# Patient Record
Sex: Female | Born: 1940 | Race: White | Hispanic: No
Health system: Midwestern US, Community
[De-identification: ages and names within clinical notes are randomized; demographics above are authoritative.]

## PROBLEM LIST (undated history)

## (undated) DIAGNOSIS — E785 Hyperlipidemia, unspecified: Secondary | ICD-10-CM

## (undated) DIAGNOSIS — I2510 Atherosclerotic heart disease of native coronary artery without angina pectoris: Secondary | ICD-10-CM

## (undated) DIAGNOSIS — R42 Dizziness and giddiness: Secondary | ICD-10-CM

## (undated) DIAGNOSIS — E039 Hypothyroidism, unspecified: Secondary | ICD-10-CM

## (undated) DIAGNOSIS — F419 Anxiety disorder, unspecified: Secondary | ICD-10-CM

## (undated) DIAGNOSIS — I10 Essential (primary) hypertension: Secondary | ICD-10-CM

## (undated) HISTORY — DX: Anxiety disorder, unspecified: F41.9

## (undated) HISTORY — DX: Hypothyroidism, unspecified: E03.9

## (undated) HISTORY — PX: CHOLECYSTECTOMY: SHX55

## (undated) HISTORY — DX: Hyperlipidemia, unspecified: E78.5

---

## 2014-06-08 ENCOUNTER — Ambulatory Visit: Admit: 2014-06-08 | Discharge: 2014-06-08 | Payer: BLUE CROSS/BLUE SHIELD | Attending: Family | Primary: Family Medicine

## 2014-06-08 DIAGNOSIS — J0111 Acute recurrent frontal sinusitis: Secondary | ICD-10-CM

## 2014-06-08 MED ORDER — AMOXICILLIN-POT CLAVULANATE 875-125 MG PO TABS
875-125 MG | ORAL_TABLET | Freq: Two times a day (BID) | ORAL | Status: AC
Start: 2014-06-08 — End: 2014-06-18

## 2014-06-08 NOTE — Progress Notes (Signed)
[facility] [place]  [address]  [place] [address]  Dept: [phone]  Dept Fax: [phone]    Roberta Barrera is a 74 y.o. female who presents today for her medical conditions/complaints as noted below.  Earnest Conroy is c/o of Sinus Problem        HPI:     Sinusitis  The current episode started 1 to 4 weeks ago. The problem is unchanged. Associated symptoms include chills, congestion, coughing and sinus pressure. Pertinent negatives include no diaphoresis, ear pain, headaches, shortness of breath, sneezing or sore throat. Treatments tried: sudafed mucinex.     Has been sick since 05-07-14, otc not working anymore, loss sense of taste and smell, does have hx of sinus issues, ahd pus pocket in mouth where partial sits this am, no pain or fever  Past Medical History   Diagnosis Date   ??? CAD (coronary artery disease)    ??? Hypertension       Past Surgical History   Procedure Laterality Date   ??? Gallbladder surgery         History reviewed. No pertinent family history.    History   Substance Use Topics   ??? Smoking status: Current Some Day Smoker   ??? Smokeless tobacco: Not on file      Comment: trying to quit as of today   ??? Alcohol Use: Not on file      Current Outpatient Prescriptions   Medication Sig Dispense Refill   ??? amoxicillin-clavulanate (AUGMENTIN) 875-125 MG per tablet Take 1 tablet by mouth 2 times daily for 10 days 20 tablet 0     No current facility-administered medications for this visit.     No Known Allergies    There are no preventive care reminders to display for this patient.    Subjective:      Review of Systems   Constitutional: Positive for chills and fatigue. Negative for fever, diaphoresis, activity change, appetite change and unexpected weight change.   HENT: Positive for congestion, rhinorrhea and sinus pressure. Negative for ear discharge, ear pain, hearing loss, postnasal drip, sneezing, sore throat and trouble swallowing.    Respiratory:  Positive for cough. Negative for chest tightness and shortness of breath.    Cardiovascular: Negative for chest pain and palpitations.   Gastrointestinal: Negative for nausea, vomiting, abdominal pain and diarrhea.   Skin: Negative for rash.   Allergic/Immunologic: Negative for environmental allergies and immunocompromised state.   Neurological: Negative for dizziness, light-headedness and headaches.   Hematological: Negative for adenopathy.   Psychiatric/Behavioral: Negative for sleep disturbance.       Objective:     Physical Exam   Constitutional: She is oriented to person, place, and time. She appears well-developed and well-nourished. No distress.   BP 132/68 mmHg   Pulse 84   Temp(Src) 97.8 ??F (36.6 ??C) (Oral)   Resp 18   Ht  (1.626 m)   Wt 161 lb (73.029 kg)   BMI 27.62 kg/m2   SpO2 98%     HENT:   Head: Normocephalic and atraumatic.   Right Ear: Hearing, tympanic membrane, external ear and ear canal normal.   Left Ear: Hearing, tympanic membrane, external ear and ear canal normal.   Nose: Mucosal edema and rhinorrhea present. Right sinus exhibits frontal sinus tenderness. Right sinus exhibits no maxillary sinus tenderness. Left sinus exhibits frontal sinus tenderness. Left sinus exhibits no maxillary sinus tenderness.   Mouth/Throat: Uvula is midline, oropharynx  is clear and moist and mucous membranes are normal. She has dentures (partial on top). No oropharyngeal exudate.   Neck: Normal range of motion. Neck supple.   Cardiovascular: Normal rate, regular rhythm and normal heart sounds.    Pulmonary/Chest: Effort normal and breath sounds normal. No respiratory distress. She has no wheezes.   Lymphadenopathy:     She has no cervical adenopathy.   Neurological: She is alert and oriented to person, place, and time.   Skin: Skin is warm and dry. No rash noted.   Psychiatric: She has a normal mood and affect. Her behavior is normal.   Nursing note and vitals reviewed.    BP 132/68 mmHg   Pulse 84   Temp(Src)  97.8 ??F (36.6 ??C) (Oral)   Resp 18   Ht 5\' 4"  (1.626 m)   Wt 161 lb (73.029 kg)   BMI 27.62 kg/m2   SpO2 98%    Assessment:      1. Acute recurrent frontal sinusitis  amoxicillin-clavulanate (AUGMENTIN) 875-125 MG per tablet       Plan:         neti pot or sinus rinse per pkg instructions  push fluids, rest  Call INB or worsening    Orders Placed This Encounter   Medications   ??? amoxicillin-clavulanate (AUGMENTIN) 875-125 MG per tablet     Sig: Take 1 tablet by mouth 2 times daily for 10 days     Dispense:  20 tablet     Refill:  0       Patient given educational materials - see patient instructions.  Discussed use, benefit, and side effects of prescribed medications.  All patient questions answered.  Pt voiced understanding. Reviewed health maintenance.  Instructed to continue current medications, diet and exercise.  Patient agreed with treatment plan. Follow up as directed.     Electronically signed by Tanna FurryKelly N Smith, CNP on 06/08/2014 at 1:36 PM

## 2014-12-21 ENCOUNTER — Ambulatory Visit: Admit: 2014-12-21 | Discharge: 2014-12-21 | Payer: MEDICARE | Attending: Family | Primary: Family Medicine

## 2014-12-21 DIAGNOSIS — B370 Candidal stomatitis: Secondary | ICD-10-CM

## 2014-12-21 MED ORDER — CHLORHEXIDINE GLUCONATE 0.12 % MT SOLN
0.12 % | Freq: Two times a day (BID) | OROMUCOSAL | 0 refills | Status: AC
Start: 2014-12-21 — End: 2015-01-04

## 2014-12-21 MED ORDER — NYSTATIN 100000 UNIT/ML MT SUSP
100000 UNIT/ML | Freq: Four times a day (QID) | OROMUCOSAL | 1 refills | Status: AC
Start: 2014-12-21 — End: ?

## 2014-12-21 NOTE — Progress Notes (Signed)
[facility] [place]  [address]  [place] [address]  Dept: [phone]  Dept Fax: [phone]    Roberta Barrera is a 74 y.o. female who presents to the urgent care today for her medical conditions/complaints as noted below.  Umaiza Matusik is c/o of Thrush      HPI:     HPI Comments: Pt complains of possible thrush.     Sinusitis   This is a new problem. Associated symptoms include congestion and sinus pressure. Pertinent negatives include no chills, coughing, diaphoresis, ear pain, headaches, shortness of breath, sneezing or sore throat.     Sinus drng clear, and improving. No fever. Has been using a lot of flonase. No antibiotic use lately. Has been using hyrdrogen peroxide and listerine for mouth rinse.. Tongue and throat do not hurt.   Past Medical History   Diagnosis Date   ??? Anxiety    ??? CAD (coronary artery disease)    ??? GERD (gastroesophageal reflux disease)    ??? Hyperlipidemia    ??? Hypertension    ??? Hypothyroidism         Current Outpatient Prescriptions   Medication Sig Dispense Refill   ??? lisinopril (PRINIVIL;ZESTRIL) 5 MG tablet      ??? levothyroxine (SYNTHROID) 125 MCG tablet      ??? ranitidine (ZANTAC) 150 MG tablet      ??? CARDIZEM LA 120 MG TB24      ??? busPIRone (BUSPAR) 10 MG tablet      ??? atorvastatin (LIPITOR) 10 MG tablet      ??? nystatin (MYCOSTATIN) 100000 UNIT/ML suspension Take 5 mLs by mouth 4 times daily 240 mL 1   ??? chlorhexidine (PERIDEX) 0.12 % solution Take 15 mLs by mouth 2 times daily for 14 days 420 mL 0     No current facility-administered medications for this visit.      No Known Allergies    Subjective:      Review of Systems   Constitutional: Negative for activity change, appetite change, chills, diaphoresis, fatigue, fever and unexpected weight change.   HENT: Positive for congestion, mouth sores, rhinorrhea and sinus pressure. Negative for dental problem, drooling, ear discharge, ear pain, hearing loss, postnasal drip,  sneezing, sore throat and trouble swallowing.    Respiratory: Negative for cough, chest tightness and shortness of breath.    Cardiovascular: Negative for chest pain and palpitations.   Gastrointestinal: Negative for abdominal pain, nausea and vomiting.   Allergic/Immunologic: Negative for immunocompromised state.   Neurological: Negative for dizziness and headaches.       Objective:     Physical Exam   Constitutional: She is oriented to person, place, and time. She appears well-developed and well-nourished. No distress.   BP 119/69 (Site: Left Arm, Position: Sitting, Cuff Size: Medium Adult)  Pulse 73  Temp 97.7 ??F (36.5 ??C) (Tympanic)   Resp 18  Ht 5\' 4"  (1.626 m)  Wt 165 lb (74.8 kg)  BMI 28.32 kg/m2     HENT:   Right Ear: Hearing, tympanic membrane, external ear and ear canal normal.   Left Ear: Hearing, tympanic membrane, external ear and ear canal normal.   Nose: Mucosal edema present. Right sinus exhibits no maxillary sinus tenderness and no frontal sinus tenderness. Left sinus exhibits no maxillary sinus tenderness and no frontal sinus tenderness.   Mouth/Throat: Uvula is midline, oropharynx is clear and moist and mucous membranes are normal. No dental abscesses or uvula swelling. No  oropharyngeal exudate.   Thick white coating to tongue and mild leukoplakia to bilateral cheeks, no open sores    Neck: Normal range of motion. Neck supple.   Cardiovascular: Normal rate, regular rhythm and normal heart sounds.    Pulmonary/Chest: Effort normal and breath sounds normal. No respiratory distress. She has no wheezes.   Lymphadenopathy:     She has no cervical adenopathy.   Neurological: She is alert and oriented to person, place, and time.   Skin: Skin is warm and dry.   Nursing note and vitals reviewed.    Visit Vitals   ??? BP 119/69 (Site: Left Arm, Position: Sitting, Cuff Size: Medium Adult)   ??? Pulse 73   ??? Temp 97.7 ??F (36.5 ??C) (Tympanic)   ??? Resp 18   ??? Ht 5\' 4"  (1.626 m)   ??? Wt 165 lb (74.8 kg)   ??? BMI 28.32  kg/m2       Assessment:      1. Oral thrush  nystatin (MYCOSTATIN) 100000 UNIT/ML suspension    chlorhexidine (PERIDEX) 0.12 % solution   2. Angular cheilitis         Plan:      Return if symptoms worsen or fail to improve.    Orders Placed This Encounter   Medications   ??? nystatin (MYCOSTATIN) 100000 UNIT/ML suspension     Sig: Take 5 mLs by mouth 4 times daily     Dispense:  240 mL     Refill:  1   ??? chlorhexidine (PERIDEX) 0.12 % solution     Sig: Take 15 mLs by mouth 2 times daily for 14 days     Dispense:  420 mL     Refill:  0    Call INB or worsening  Rinse mouth after flonase use-in case of swallowing to much of it  Continue probiotics  Neosporin and or witch hazel to corners of lips.      Patient given educational materials - see patient instructions.  Discussed use, benefit, and side effects of prescribed medications.  All patient questions answered.  Pt voiced understanding.    Electronically signed by Tanna Furry, CNP on 12/21/2014 at 1:07 PM

## 2015-04-27 ENCOUNTER — Encounter (HOSPITAL_COMMUNITY): Payer: Self-pay | Admitting: *Deleted

## 2015-04-27 ENCOUNTER — Emergency Department (HOSPITAL_COMMUNITY)
Admission: EM | Admit: 2015-04-27 | Discharge: 2015-04-27 | Disposition: A | Discharge status: Home / Self Care | Payer: Medicare Other | Attending: Emergency Medicine | Admitting: Emergency Medicine

## 2015-04-27 ENCOUNTER — Emergency Department (HOSPITAL_COMMUNITY): Payer: Medicare Other

## 2015-04-27 DIAGNOSIS — R200 Anesthesia of skin: Secondary | ICD-10-CM | POA: Diagnosis not present

## 2015-04-27 DIAGNOSIS — I10 Essential (primary) hypertension: Secondary | ICD-10-CM | POA: Insufficient documentation

## 2015-04-27 DIAGNOSIS — Z79899 Other long term (current) drug therapy: Secondary | ICD-10-CM | POA: Diagnosis not present

## 2015-04-27 DIAGNOSIS — E7800 Pure hypercholesterolemia, unspecified: Secondary | ICD-10-CM | POA: Insufficient documentation

## 2015-04-27 DIAGNOSIS — F17210 Nicotine dependence, cigarettes, uncomplicated: Secondary | ICD-10-CM | POA: Diagnosis not present

## 2015-04-27 DIAGNOSIS — R42 Dizziness and giddiness: Secondary | ICD-10-CM | POA: Diagnosis present

## 2015-04-27 DIAGNOSIS — Z7982 Long term (current) use of aspirin: Secondary | ICD-10-CM | POA: Diagnosis not present

## 2015-04-27 DIAGNOSIS — I2510 Atherosclerotic heart disease of native coronary artery without angina pectoris: Secondary | ICD-10-CM | POA: Diagnosis not present

## 2015-04-27 HISTORY — DX: Atherosclerotic heart disease of native coronary artery without angina pectoris: I25.10

## 2015-04-27 HISTORY — DX: Essential (primary) hypertension: I10

## 2015-04-27 HISTORY — DX: Dizziness and giddiness: R42

## 2015-04-27 LAB — CBC
HEMATOCRIT: 42.9 % (ref 36.0–46.0)
HEMOGLOBIN: 14.5 g/dL (ref 12.0–15.0)
MCH: 31.5 pg (ref 26.0–34.0)
MCHC: 33.8 g/dL (ref 30.0–36.0)
MCV: 93.3 fL (ref 78.0–100.0)
PLATELETS: 340 10*3/uL (ref 150–400)
RBC: 4.6 MIL/uL (ref 3.87–5.11)
RDW: 13.4 % (ref 11.5–15.5)
WBC: 12.1 10*3/uL — ABNORMAL HIGH (ref 4.0–10.5)

## 2015-04-27 LAB — BASIC METABOLIC PANEL
ANION GAP: 9 (ref 5–15)
BUN: 12 mg/dL (ref 6–20)
CO2: 22 mmol/L (ref 22–32)
Calcium: 9.9 mg/dL (ref 8.9–10.3)
Chloride: 106 mmol/L (ref 101–111)
Creatinine, Ser: 0.9 mg/dL (ref 0.44–1.00)
GFR calc Af Amer: >60 mL/min (ref >60)
GLUCOSE: 94 mg/dL (ref 65–99)
POTASSIUM: 4.2 mmol/L (ref 3.5–5.1)
Sodium: 137 mmol/L (ref 135–145)

## 2015-04-27 LAB — CBG MONITORING, ED: GLUCOSE-CAPILLARY: 88 mg/dL (ref 65–99)

## 2015-04-27 NOTE — Discharge Instructions (Signed)
Follow-up with your primary Dr. as soon as possible. You will require a CT angioma of your head and neck to complete your workup.  Return to the ER symptoms significantly worsen or change.   Dizziness Dizziness is a common problem. It is a feeling of unsteadiness or light-headedness. You may feel like you are about to faint. Dizziness can lead to injury if you stumble or fall. Anyone can become dizzy, but dizziness is more common in older adults. This condition can be caused by a number of things, including medicines, dehydration, or illness. HOME CARE INSTRUCTIONS Taking these steps may help with your condition: Eating and Drinking  Drink enough fluid to keep your urine clear or pale yellow. This helps to keep you from becoming dehydrated. Try to drink more clear fluids, such as water.  Do not drink alcohol.  Limit your caffeine intake if directed by your health care provider.  Limit your salt intake if directed by your health care provider. Activity  Avoid making quick movements.  Rise slowly from chairs and steady yourself until you feel okay.  In the morning, first sit up on the side of the bed. When you feel okay, stand slowly while you hold onto something until you know that your balance is fine.  Move your legs often if you need to stand in one place for a long time. Tighten and relax your muscles in your legs while you are standing.  Do not drive or operate heavy machinery if you feel dizzy.  Avoid bending down if you feel dizzy. Place items in your home so that they are easy for you to reach without leaning over. Lifestyle  Do not use any tobacco products, including cigarettes, chewing tobacco, or electronic cigarettes. If you need help quitting, ask your health care provider.  Try to reduce your stress level, such as with yoga or meditation. Talk with your health care provider if you need help. General Instructions  Watch your dizziness for any changes.  Take  medicines only as directed by your health care provider. Talk with your health care provider if you think that your dizziness is caused by a medicine that you are taking.  Tell a friend or a family member that you are feeling dizzy. If he or she notices any changes in your behavior, have this person call your health care provider.  Keep all follow-up visits as directed by your health care provider. This is important. SEEK MEDICAL CARE IF:  Your dizziness does not go away.  Your dizziness or light-headedness gets worse.  You feel nauseous.  You have reduced hearing.  You have new symptoms.  You are unsteady on your feet or you feel like the room is spinning. SEEK IMMEDIATE MEDICAL CARE IF:  You vomit or have diarrhea and are unable to eat or drink anything.  You have problems talking, walking, swallowing, or using your arms, hands, or legs.  You feel generally weak.  You are not thinking clearly or you have trouble forming sentences. It may take a friend or family member to notice this.  You have chest pain, abdominal pain, shortness of breath, or sweating.  Your vision changes.  You notice any bleeding.  You have a headache.  You have neck pain or a stiff neck.  You have a fever.   This information is not intended to replace advice given to you by your health care provider. Make sure you discuss any questions you have with your health care provider.  Document Released: 11/13/2000 Document Revised: 10/04/2014 Document Reviewed: 05/16/2014 Elsevier Interactive Patient Education [employer].

## 2015-04-27 NOTE — ED Notes (Signed)
Pt c/o dizzy spell 30 minutes ago that lasted 5 minutes. States that her left leg also felt heavy. States all symptoms are gone. Reports history of frequent dizzy spells related to vertigo.

## 2015-04-27 NOTE — ED Provider Notes (Signed)
CSN: [id number]     Arrival date & time 04/27/15  1658 History   First MD Initiated Contact with Patient 04/27/15 1742     Chief Complaint  Patient presents with  . Dizziness     (Consider location/radiation/quality/duration/timing/severity/associated sxs/prior Treatment) HPI Comments: Patient is a 74 year old female with history of hypertension and high cholesterol. She presents for evaluation of an episode of dizziness and left leg numbness that occurred while helping prepare Thanksgiving dinner. This lasted for approximately 5 minutes and resolved after she sat down and rested. She describes the dizziness as a spinning type sensation and states that the floor was moving. She now feels fine and has no complaints. 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  Patient is a 74 y.o. female presenting with dizziness. The history is provided by the patient.  Dizziness Quality:  Vertigo and imbalance Severity:  Moderate Onset quality:  Sudden Duration: 5 minutes. Timing:  Constant Progression:  Resolved Chronicity:  New Relieved by:  Nothing Worsened by:  Nothing Ineffective treatments:  None tried   Past Medical History  Diagnosis Date  . Hypertension   . Vertigo   . Coronary artery disease    Past Surgical History  Procedure Laterality Date  . Cholecystectomy     No family history on file. Social History  Substance Use Topics  . Smoking status: Current Every Day Smoker -- 1.00 packs/day    Types: Cigarettes  . Smokeless tobacco: None  . Alcohol Use: No   OB History    No data available     Review of Systems  Neurological: Positive for dizziness.  All other systems reviewed and are negative.     Allergies  Review of patient's allergies indicates no known allergies.  Home Medications   Prior to Admission medications   Medication Sig Start Date End Date Taking? Authorizing Provider  acetaminophen (TYLENOL) 500 MG  tablet Take 500 mg by mouth every 6 (six) hours as needed for mild pain.   Yes Historical Provider, MD  aspirin 81 MG chewable tablet Chew 81 mg by mouth daily.   Yes Historical Provider, MD  atorvastatin (LIPITOR) 10 MG tablet Take 10 mg by mouth daily.   Yes Historical Provider, MD  busPIRone (BUSPAR) 10 MG tablet Take 10 mg by mouth daily.   Yes Historical Provider, MD  cholecalciferol (VITAMIN D) 400 UNITS TABS tablet Take 400 Units by mouth daily.   Yes Historical Provider, MD  Cranberry 400 MG TABS Take 1 tablet by mouth daily.   Yes Historical Provider, MD  diltiazem (CARDIZEM) 120 MG tablet Take 120 mg by mouth daily.   Yes Historical Provider, MD  diphenhydrAMINE (SOMINEX) 25 MG tablet Take 25 mg by mouth at bedtime.   Yes Historical Provider, MD  folic acid (FOLVITE) 1 MG tablet Take 1 mg by mouth daily.   Yes Historical Provider, MD  levothyroxine (SYNTHROID, LEVOTHROID) 150 MCG tablet Take 150 mcg by mouth daily before breakfast.   Yes Historical Provider, MD  lisinopril (PRINIVIL,ZESTRIL) 5 MG tablet Take 5 mg by mouth daily.   Yes Historical Provider, MD  Multiple Vitamins-Minerals (CENTRUM SILVER ULTRA WOMENS PO) Take 1 tablet by mouth daily.   Yes Historical Provider, MD  pseudoephedrine (SUDAFED) 30 MG tablet Take 30 mg by mouth 3 (three) times daily as needed for congestion.   Yes Historical Provider, MD  ranitidine (ZANTAC) 150 MG capsule Take 150 mg by mouth 2 (two) times daily.  Yes Historical Provider, MD  vitamin B-12 (CYANOCOBALAMIN) 100 MCG tablet Take 100 mcg by mouth daily.   Yes Historical Provider, MD  vitamin E 400 UNIT capsule Take 400 Units by mouth daily.   Yes Historical Provider, MD   BP 127/55 mmHg  Pulse 74  Resp 15  SpO2 100% Physical Exam  Constitutional: She is oriented to person, place, and time. She appears well-developed and well-nourished. No distress.  HENT:  Head: Normocephalic and atraumatic.  Eyes: EOM are normal. Pupils are equal, round, and  reactive to light.  Neck: Normal range of motion. Neck supple.  Cardiovascular: Normal rate and regular rhythm.  Exam reveals no gallop and no friction rub.   No murmur heard. Pulmonary/Chest: Effort normal and breath sounds normal. No respiratory distress. She has no wheezes.  Abdominal: Soft. Bowel sounds are normal. She exhibits no distension. There is no tenderness.  Musculoskeletal: Normal range of motion.  Neurological: She is alert and oriented to person, place, and time. No cranial nerve deficit. She exhibits normal muscle tone. Coordination normal.  Skin: Skin is warm and dry. She is not diaphoretic.  Nursing note and vitals reviewed.   ED Course  Procedures (including critical care time) Labs Review Labs Reviewed  CBC - Abnormal; Notable for the following:    WBC 12.1 (*)    All other components within normal limits  BASIC METABOLIC PANEL  URINALYSIS, ROUTINE W REFLEX MICROSCOPIC (NOT AT [facility])  CBG MONITORING, ED    Imaging Review No results found. I have personally reviewed and evaluated these images and lab results as part of my medical decision-making.   EKG Interpretation   Date/Time:  Thursday April 27 2015 17:08:17 EST Ventricular Rate:  92 PR Interval:  122 QRS Duration: 70 QT Interval:  342 QTC Calculation: 422 R Axis:   31 Text Interpretation:  Sinus rhythm with sinus arrhythmia with occasional  Premature ventricular complexes Low voltage QRS Nonspecific T wave  abnormality Abnormal ECG Confirmed by Lyndon Chenoweth  MD, Yanil Dawe ([id number]) on  04/27/2015 6:46:07 PM      MDM   Final diagnoses:  None    Patient presents here with symptoms that lasted approximately 5 minutes that may well be explained by a TIA. Her head CT is negative and remainder the workup is negative at this point. I discussed the results of the tests with Dr. Amada JupiterKirkpatrick from neurology. It was his recommendation that the patient be admitted for a TIA workup, and at the very least undergo a  CT angiogram of the head and neck.  The patient is very adamant about going home at this time. She has an appointment with her doctor the beginning of next week and will discuss having these tests done at that time. She understands there are risks associated with not fully explaining the nature of her symptoms, including death, severe stroke with disability.    Geoffery Lyonsouglas Loran Auguste, MD 04/27/15 2027

## 2015-05-15 ENCOUNTER — Other Ambulatory Visit: Payer: Self-pay | Admitting: Family Medicine

## 2015-05-15 DIAGNOSIS — R29818 Other symptoms and signs involving the nervous system: Secondary | ICD-10-CM

## 2015-05-16 ENCOUNTER — Other Ambulatory Visit: Payer: Self-pay

## 2015-05-16 DIAGNOSIS — Z1231 Encounter for screening mammogram for malignant neoplasm of breast: Secondary | ICD-10-CM

## 2015-05-25 ENCOUNTER — Other Ambulatory Visit: Payer: Medicare Other

## 2015-06-02 ENCOUNTER — Inpatient Hospital Stay: Admission: RE | Admit: 2015-06-02 | Payer: Medicare Other | Source: Ambulatory Visit

## 2015-06-07 ENCOUNTER — Ambulatory Visit: Payer: Medicare Other

## 2015-06-09 ENCOUNTER — Ambulatory Visit
Admission: RE | Admit: 2015-06-09 | Discharge: 2015-06-09 | Disposition: A | Discharge status: Home / Self Care | Payer: Medicare Other | Source: Ambulatory Visit | Attending: Family Medicine | Admitting: Family Medicine

## 2015-06-09 DIAGNOSIS — R29818 Other symptoms and signs involving the nervous system: Secondary | ICD-10-CM

## 2015-06-09 MED ORDER — IOPAMIDOL (ISOVUE-370) INJECTION 76%
100.0000 mL | Freq: Once | INTRAVENOUS | Status: AC | PRN
  Administered 2015-06-09: 100 mL via INTRAVENOUS

## 2015-06-19 ENCOUNTER — Ambulatory Visit
Admission: RE | Admit: 2015-06-19 | Discharge: 2015-06-19 | Disposition: A | Discharge status: Home / Self Care | Payer: Medicare Other | Source: Ambulatory Visit

## 2015-06-19 DIAGNOSIS — Z1231 Encounter for screening mammogram for malignant neoplasm of breast: Secondary | ICD-10-CM

## 2015-07-18 ENCOUNTER — Ambulatory Visit (INDEPENDENT_AMBULATORY_CARE_PROVIDER_SITE_OTHER): Payer: Medicare Other | Admitting: Neurology

## 2015-07-18 ENCOUNTER — Encounter: Payer: Self-pay | Admitting: Neurology

## 2015-07-18 VITALS — BP 137/73 | HR 87 | Ht 64.0 in | Wt 166.4 lb

## 2015-07-18 DIAGNOSIS — H832X9 Labyrinthine dysfunction, unspecified ear: Secondary | ICD-10-CM | POA: Diagnosis not present

## 2015-07-18 DIAGNOSIS — H8190 Unspecified disorder of vestibular function, unspecified ear: Secondary | ICD-10-CM | POA: Insufficient documentation

## 2015-07-18 NOTE — Patient Instructions (Signed)
I had a long discussion with the patient and her daughter regarding her episode of transient dizziness and imbalance possibly being related to mild peripheral vestibular dysfunction however vertebrobasilar ischemia and TIA due to small vessel disease is also possible given her advanced age. I recommend he continue aspirin for stroke prevention and check MRI scan of the brain with and without contrast and fasting hemoglobin A1c and lipid profile. I have advised to be careful and to avoid sudden  jerky neck movements and while bending to do so slowly. She will return for follow-up in 2 months or call earlier if necessary.

## 2015-07-18 NOTE — Progress Notes (Signed)
[facility] [address] [place]. [place] [address] [phone]       OFFICE CONSULT NOTE  [name] Date of Birth:  1941/02/24 Medical Record Number:  [id number]   Referring MD:  Geoffery Lyons Reason for Referral:  Dizziness  HPI: 7 year Caucasian lady who developed sudden onset of transient dizzy sensation on 04/27/15. She was standing at the kitchen counter laughing and talking to her daughter when she suddenly looked down and noticed that the floor was wavy and coming up. She felt unsteady and had to hold on [facility] she felt her left leg was heavy. However she was able to hold onto the counter and walk and sit down in a chair and the feeling passed over 3-5 minutes. She denied any nausea, vertigo, loss of vision, blurred vision or extremity weakness. This really has not happened again. She however does have history of tinnitus as well as some occasional dizziness with sudden head movements. She has no history of decreased hearing. She has chronic sinusitis off and on and does feel her sinuses are full. At times when she is taking medications for sinus issues felt dizzy and off balance. She has no known history of stroke, TIA, significant neurological problems. She had CT angiogram of the brain and neck performed on 04/27/15 which I personally reviewed and does not show significant extra or intracranial stenosis and only mild atheromatous changes are noted. CT scan of the head was unremarkable. She states she's had some lab work done at her annual physical exam by primary physician a few months ago but I do not have access to those labs.  ROS:   14 system review of systems is positive for  ringing in the ears, spinning sensation, skin moles, allergies, runny nose, restless legs  PMH:  Past Medical History  Diagnosis Date  . Hypertension   . Vertigo   . Coronary artery disease   . Hypothyroidism     Social History:  Social History   Social History  . Marital  Status: Married    Spouse Name: N/A  . Number of Children: N/A  . Years of Education: N/A   Occupational History  . Not on file.   Social History Main Topics  . Smoking status: Current Every Day Smoker -- 1.00 packs/day    Types: Cigarettes  . Smokeless tobacco: Not on file  . Alcohol Use: 0.6 oz/week    1 Glasses of wine per week     Comment: occasionalaly  . Drug Use: No  . Sexual Activity: Not on file   Other Topics Concern  . Not on file   Social History Narrative    Medications:   Current Outpatient Prescriptions on File Prior to Visit  Medication Sig Dispense Refill  . acetaminophen (TYLENOL) 500 MG tablet Take 500 mg by mouth every 6 (six) hours as needed for mild pain.    Marland Kitchen aspirin 81 MG chewable tablet Chew 81 mg by mouth daily.    Marland Kitchen atorvastatin (LIPITOR) 10 MG tablet Take 10 mg by mouth daily.    . busPIRone (BUSPAR) 10 MG tablet Take 10 mg by mouth daily.    . cholecalciferol (VITAMIN D) 400 UNITS TABS tablet Take 400 Units by mouth daily.    . Cranberry 400 MG TABS Take 1 tablet by mouth daily.    Marland Kitchen diltiazem (CARDIZEM) 120 MG tablet Take 120 mg by mouth daily.    . diphenhydrAMINE (SOMINEX) 25 MG tablet Take 25 mg by  mouth at bedtime.    . folic acid (FOLVITE) 1 MG tablet Take 1 mg by mouth daily.    Marland Kitchen levothyroxine (SYNTHROID, LEVOTHROID) 150 MCG tablet Take 150 mcg by mouth daily before breakfast.    . lisinopril (PRINIVIL,ZESTRIL) 5 MG tablet Take 5 mg by mouth daily.    . Multiple Vitamins-Minerals (CENTRUM SILVER ULTRA WOMENS PO) Take 1 tablet by mouth daily.    . pseudoephedrine (SUDAFED) 30 MG tablet Take 30 mg by mouth 3 (three) times daily as needed for congestion.    . ranitidine (ZANTAC) 150 MG capsule Take 150 mg by mouth 2 (two) times daily.    . vitamin B-12 (CYANOCOBALAMIN) 100 MCG tablet Take 100 mcg by mouth daily.    . vitamin E 400 UNIT capsule Take 400 Units by mouth daily.     No current facility-administered medications on file prior to  visit.    Allergies:  No Known Allergies  Physical Exam General: well developed, well nourished pleasant elderly Caucasian lady, seated, in no evident distress Head: head normocephalic and atraumatic.   Neck: supple with no carotid or supraclavicular bruits Cardiovascular: regular rate and rhythm, no murmurs Musculoskeletal: no deformity Skin:  no rash/petichiae Vascular:  Normal pulses all extremities  Neurologic Exam Mental Status: Awake and fully alert. Oriented to place and time. Recent and remote memory intact. Attention span, concentration and fund of knowledge appropriate. Mood and affect appropriate.  Cranial Nerves: Fundoscopic exam reveals sharp disc margins. Pupils equal, briskly reactive to light. Extraocular movements full without nystagmus. Visual fields full to confrontation. Hearing intact. Facial sensation intact. Face, tongue, palate moves normally and symmetrically.  Motor: Normal bulk and tone. Normal strength in all tested extremity muscles. Sensory.: intact to touch , pinprick , position and vibratory sensation.  Coordination: Rapid alternating movements normal in all extremities. Finger-to-nose and heel-to-shin performed accurately bilaterally. Fukuda stepping test is positive with patient moving off base and rotating to the right. Hallpike maneuver was not performed. Head shaking produces mild subjective dizziness but no objective nystagmus. Gait and Station: Arises from chair without difficulty. Stance is normal. Gait demonstrates normal stride length and balance . Able to heel, toe and tandem walk with slight difficulty.  Reflexes: 1+ and symmetric. Toes downgoing.   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 I recommend he continue aspirin for stroke prevention and check MRI scan of the brain with and without contrast and fasting hemoglobin A1c and lipid profile. I have advised to be careful and to avoid sudden  jerky neck movements and while bending to do so slowly. She will return for follow-up in 2 months or call earlier if necessary. Delia Heady, MD  Note: This document was prepared with digital dictation and possible smart phrase technology. Any transcriptional errors that result from this process are unintentional.

## 2015-07-28 ENCOUNTER — Ambulatory Visit
Admission: RE | Admit: 2015-07-28 | Discharge: 2015-07-28 | Disposition: A | Discharge status: Home / Self Care | Payer: Medicare Other | Source: Ambulatory Visit | Attending: Neurology | Admitting: Neurology

## 2015-07-28 DIAGNOSIS — H832X9 Labyrinthine dysfunction, unspecified ear: Secondary | ICD-10-CM | POA: Diagnosis not present

## 2015-07-28 MED ORDER — GADOBENATE DIMEGLUMINE 529 MG/ML IV SOLN: 15.0000 mL | Freq: Once | INTRAVENOUS | Status: DC | PRN

## 2015-09-27 ENCOUNTER — Ambulatory Visit (INDEPENDENT_AMBULATORY_CARE_PROVIDER_SITE_OTHER): Payer: Medicare Other | Admitting: Neurology

## 2015-09-27 ENCOUNTER — Encounter: Payer: Self-pay | Admitting: Neurology

## 2015-09-27 VITALS — BP 115/69 | HR 88 | Ht 64.0 in | Wt 167.2 lb

## 2015-09-27 DIAGNOSIS — R42 Dizziness and giddiness: Secondary | ICD-10-CM

## 2015-09-27 NOTE — Patient Instructions (Addendum)
I had a long discussion with the patient   regarding her episode of transient dizziness and imbalance possibly being related to mild peripheral vestibular dysfunction . I recommend he continue aspirin for stroke prevention  I have advised to be careful and to avoid sudden jerky neck movements and while bending to do so slowly. She is refusing referral to vestibular rehabilitation or to do dizziness exercises as she feels she tried them in the past 5 years ago and did not help She will return for follow-up in the future only as necessary and no routine follow-up appointment is necessary

## 2015-09-27 NOTE — Progress Notes (Signed)
[facility] [address] [place]. [place] [address] [phone]       OFFICE FOLLOW UP VISIT NOTE  [name] Date of Birth:  [birth year] Medical Record Number:  [id number]   Referring MD:  Geoffery Lyons Reason for Referral:  Dizziness  HPI: 73 year Caucasian lady who developed sudden onset of transient dizzy sensation on 04/27/15. She was standing at the kitchen counter laughing and talking to her daughter when she suddenly looked down and noticed that the floor was wavy and coming up. She felt unsteady and had to hold on.she felt her left leg was heavy. However she was able to hold onto the counter and walk and sit down in a chair and the feeling passed over 3-5 minutes. She denied any nausea, vertigo, loss of vision, blurred vision or extremity weakness. This really has not happened again. She however does have history of tinnitus as well as some occasional dizziness with sudden head movements. She has no history of decreased hearing. She has chronic sinusitis off and on and does feel her sinuses are full. At times when she is taking medications for sinus issues felt dizzy and off balance. She has no known history of stroke, TIA, significant neurological problems. She had CT angiogram of the brain and neck performed on 04/27/15 which I personally reviewed and does not show significant extra or intracranial stenosis and only mild atheromatous changes are noted. CT scan of the head was unremarkable. She states she's had some lab work done at her annual physical exam by primary physician a few months ago but I do not have access to those labs. Update 09/27/2015 : She returns for follow-up after initial consultation 2 months ago. She states she continues to have intermittent dizziness mainly related to getting up suddenly and making a sudden movement. She's had no falls. She denies vertigo. She did have a recent allergy and sinus flareup which worsen her dizziness but this seems  to be settling down. She does recall history in the past for similar episode of dizziness and was seen in the dizziness clinic in [facility] and was advised to vestibular rehabilitation and dizziness exercises which she did "a little while which no relief and stopped doing it. She is refusing referral to vestibular rehabilitation at the present time. She had MRI scan of the brain done on 07/18/15 which I personally reviewed and shows tiny nonspecific white matter hyperintensities, consistent with small vessel disease. No structural lesion tomorrow infarcts are noted. Lipid profile and hemoglobin A1c was advised and patient states she had it done but she forgot to ring the reports for my review today. She states her blood pressure is well controlled and today it is 115/69 in office. ROS:   14 system review of systems is positive for ringing in the ears, runny nose, restless leg, frequent waking, daytime sleepiness, bladder urgency, dizziness, environmental allergies and all other systems negative  PMH:  Past Medical History  Diagnosis Date  . Hypertension   . Vertigo   . Coronary artery disease   . Hypothyroidism     Social History:  Social History   Social History  . Marital Status: Married    Spouse Name: N/A  . Number of Children: N/A  . Years of Education: N/A   Occupational History  . Not on file.   Social History Main Topics  . Smoking status: Current Every Day Smoker -- 1.00 packs/day    Types: Cigarettes  . Smokeless tobacco: Not  on file  . Alcohol Use: 0.6 oz/week    1 Glasses of wine per week     Comment: occasionalaly  . Drug Use: No  . Sexual Activity: Not on file   Other Topics Concern  . Not on file   Social History Narrative    Medications:   Current Outpatient Prescriptions on File Prior to Visit  Medication Sig Dispense Refill  . acetaminophen (TYLENOL) 500 MG tablet Take 500 mg by mouth every 6 (six) hours as needed for mild pain.    Marland Kitchen aspirin 81 MG  chewable tablet Chew 81 mg by mouth daily.    Marland Kitchen atorvastatin (LIPITOR) 10 MG tablet Take 10 mg by mouth daily.    . busPIRone (BUSPAR) 10 MG tablet Take 10 mg by mouth daily.    . cholecalciferol (VITAMIN D) 400 UNITS TABS tablet Take 400 Units by mouth daily.    . Cranberry 400 MG TABS Take 1 tablet by mouth daily.    Marland Kitchen diltiazem (CARDIZEM) 120 MG tablet Take 120 mg by mouth daily.    . diphenhydrAMINE (SOMINEX) 25 MG tablet Take 25 mg by mouth at bedtime.    . folic acid (FOLVITE) 1 MG tablet Take 1 mg by mouth daily.    Marland Kitchen lisinopril (PRINIVIL,ZESTRIL) 5 MG tablet Take 5 mg by mouth daily.    . Multiple Vitamins-Minerals (CENTRUM SILVER ULTRA WOMENS PO) Take 1 tablet by mouth daily.    . pseudoephedrine (SUDAFED) 30 MG tablet Take 30 mg by mouth 3 (three) times daily as needed for congestion.    . ranitidine (ZANTAC) 150 MG capsule Take 150 mg by mouth 2 (two) times daily.    . vitamin B-12 (CYANOCOBALAMIN) 100 MCG tablet Take 100 mcg by mouth daily.    . vitamin E 400 UNIT capsule Take 400 Units by mouth daily.     No current facility-administered medications on file prior to visit.    Allergies:  No Known Allergies  Physical Exam General: well developed, well nourished pleasant elderly Caucasian lady, seated, in no evident distress Head: head normocephalic and atraumatic.   Neck: supple with no carotid or supraclavicular bruits Cardiovascular: regular rate and rhythm, no murmurs Musculoskeletal: no deformity Skin:  no rash/petichiae Vascular:  Normal pulses all extremities  Neurologic Exam Mental Status: Awake and fully alert. Oriented to place and time. Recent and remote memory intact. Attention span, concentration and fund of knowledge appropriate. Mood and affect appropriate.  Cranial Nerves: Fundoscopic exam not done  . Pupils equal, briskly reactive to light. Extraocular movements full without nystagmus. Visual fields full to confrontation. Hearing intact. Facial sensation  intact. Face, tongue, palate moves normally and symmetrically.  Motor: Normal bulk and tone. Normal strength in all tested extremity muscles. Sensory.: intact to touch , pinprick , position and vibratory sensation.  Coordination: Rapid alternating movements normal in all extremities. Finger-to-nose and heel-to-shin performed accurately bilaterally. Fukuda stepping test is positive with patient moving off base and rotating to the right. Hallpike maneuver was not performed.   Gait and Station: Arises from chair without difficulty. Stance is normal. Gait demonstrates normal stride length and balance . Able to heel, toe and tandem walk with slight difficulty.  Reflexes: 1+ and symmetric. Toes downgoing.       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 I recommend he continue aspirin for stroke prevention  I have advised to be careful and to avoid sudden jerky neck movements and while bending to do so slowly. She is refusing referral to vestibular rehabilitation or to do dizziness exercises as she feels she tried them in the past 5 years ago and did not help .greater than 50% time during this 25 minute visit was spent on counseling and coordination of care about her dizziness She will return for follow-up in the future only as necessary and no routine follow-up appointment is necessary Delia HeadyPramod Eulises Kijowski, MD  Note: This document was prepared with digital dictation and possible smart phrase technology. Any transcriptional errors that result from this process are unintentional.

## 2016-12-13 IMAGING — CT CT ANGIO NECK
1 of 10 series · 5 of 33 positions shown · IV contrast (isovue)
Comparison: Noncontrast CT head 04/27/2015.

CLINICAL DATA: Transient neurologic symptoms occurring in [REDACTED].
Wooziness. LEFT leg heaviness. Vertigo with pain and pressure in the
head.

EXAM:
CT ANGIOGRAPHY HEAD AND NECK
TECHNIQUE: Multidetector CT imaging of the head and neck was performed using
the standard protocol during bolus administration of intravenous
contrast. Multiplanar CT image reconstructions and MIPs were
obtained to evaluate the vascular anatomy. Carotid stenosis
measurements (when applicable) are obtained utilizing NASCET
criteria, using the distal internal carotid diameter as the
denominator.
CONTRAST:  Isovue 370, 100 mL.

[Series 604: axial thin · axial · 0.54mm/px · z∈[+179,+425]mm · 5 of 370 slices shown]
[im 62/370  soft-tissue]
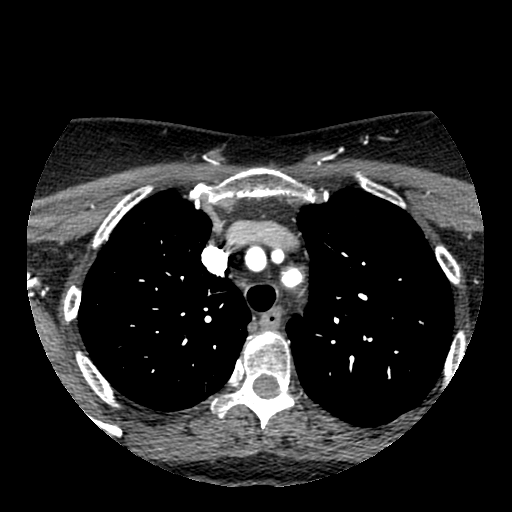
[im 124/370  bone]
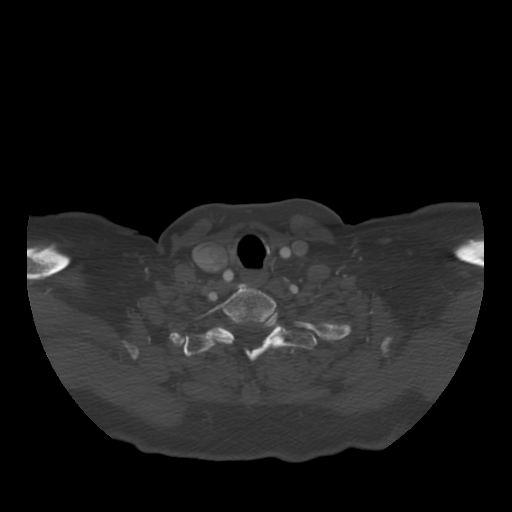
[im 185/370  soft-tissue]
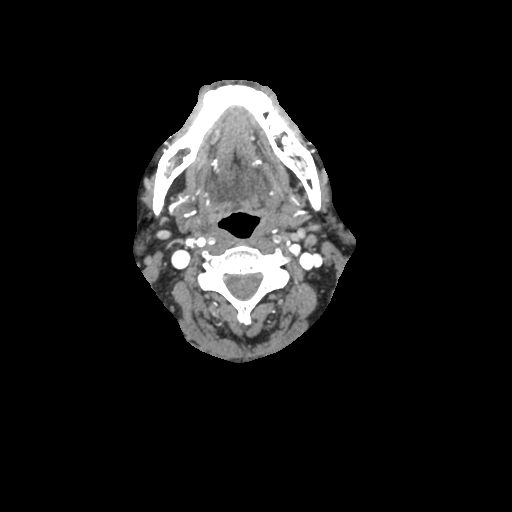
[im 247/370  bone]
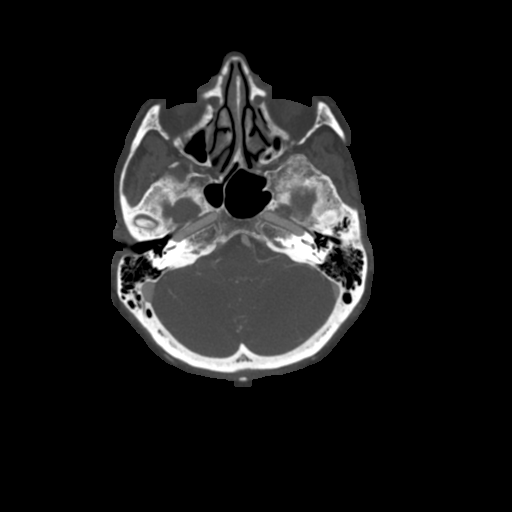
[im 308/370  soft-tissue]
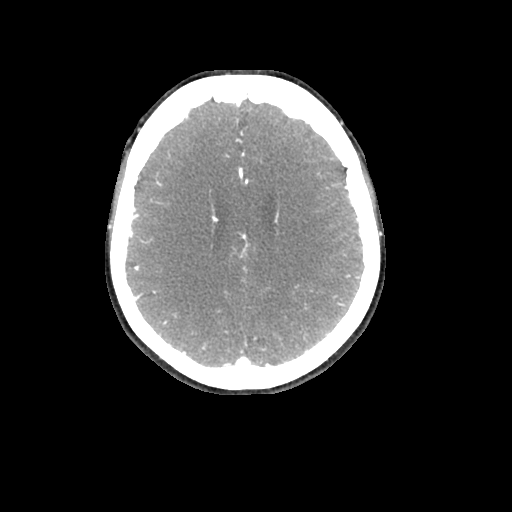

[5 of 33 positions shown; findings below may reference images not displayed]

FINDINGS: CT HEAD

Calvarium and skull base: No fracture or destructive lesion.
Mastoids and middle ears are grossly clear.

Paranasal sinuses: Imaged portions are clear.

Orbits: Negative.

Brain: No evidence of acute abnormality, including acute infarct,
hemorrhage, hydrocephalus, or mass lesion. There was a question of
an acute abnormality on previous CT, but this has not progressed,
and does not appear to represent a subacute or chronic infarction.
Patchy subcortical white matter hypoattenuation likely represents
chronic microvascular ischemic change. Tiny remote LEFT basal
ganglia lacunar infarct.

CTA NECK

Aortic arch: Standard branching. Imaged portion shows no evidence of
aneurysm or dissection. No significant stenosis of the major arch
vessel origins.

Right carotid system: Minor atheromatous change at the bifurcation.
No evidence of dissection, stenosis (50% or greater) or occlusion.

Left carotid system: Minor atheromatous change the bifurcation. No
evidence of dissection, stenosis (50% or greater) or occlusion.

Vertebral arteries: Codominant. No evidence of dissection, stenosis
(50% or greater) or occlusion.

Nonvascular soft tissues: COPD changes at the lung apices. No neck
masses. Mild spondylosis. Unremarkable thyroid.

CTA HEAD

Anterior circulation: Minor cavernous carotid disease. No
significant stenosis, proximal occlusion, aneurysm, or vascular
malformation.

Posterior circulation: No significant stenosis, proximal occlusion,
aneurysm, or vascular malformation.

Venous sinuses: As permitted by contrast timing, patent.

Anatomic variants: None of significance.

Delayed phase:   No abnormal intracranial enhancement.
IMPRESSION: No acute intracranial findings. Normal cerebral volume with
suspected mild chronic microvascular ischemic change. Remote LEFT
lenticular nucleus lacunar infarct.

No intracranial or extracranial flow reducing lesions of
significance. Minor atheromatous changes at the carotid
bifurcations, non stenotic.

## 2018-07-07 ENCOUNTER — Ambulatory Visit: Payer: Medicare Other | Admitting: Obstetrics & Gynecology

## 2019-03-03 ENCOUNTER — Other Ambulatory Visit (HOSPITAL_COMMUNITY): Payer: Self-pay | Admitting: Sports Medicine

## 2019-03-03 ENCOUNTER — Other Ambulatory Visit: Payer: Self-pay | Admitting: Sports Medicine

## 2019-03-03 DIAGNOSIS — S34119A Complete lesion of unspecified level of lumbar spinal cord, initial encounter: Secondary | ICD-10-CM

## 2019-03-16 ENCOUNTER — Other Ambulatory Visit: Payer: Self-pay

## 2019-03-16 ENCOUNTER — Encounter (HOSPITAL_COMMUNITY)
Admission: RE | Admit: 2019-03-16 | Discharge: 2019-03-16 | Disposition: A | Discharge status: Home / Self Care | Payer: Medicare Other | Source: Ambulatory Visit | Attending: Sports Medicine | Admitting: Sports Medicine

## 2019-03-16 DIAGNOSIS — S34119A Complete lesion of unspecified level of lumbar spinal cord, initial encounter: Secondary | ICD-10-CM

## 2019-03-16 MED ORDER — TECHNETIUM TC 99M MEDRONATE IV KIT
21.0000 | PACK | Freq: Once | INTRAVENOUS | Status: AC | PRN
Start: 2019-03-16 — End: 2019-03-16
  Administered 2019-03-16: 21 via INTRAVENOUS

## 2019-05-24 ENCOUNTER — Ambulatory Visit: Payer: Medicare Other | Attending: Internal Medicine

## 2019-05-24 DIAGNOSIS — Z20822 Contact with and (suspected) exposure to covid-19: Secondary | ICD-10-CM

## 2019-05-25 LAB — NOVEL CORONAVIRUS, NAA: SARS-CoV-2, NAA: NOT DETECTED

## 2019-06-27 ENCOUNTER — Ambulatory Visit: Payer: Medicare Other | Attending: Internal Medicine

## 2019-06-27 DIAGNOSIS — Z23 Encounter for immunization: Secondary | ICD-10-CM | POA: Insufficient documentation

## 2019-06-27 NOTE — Progress Notes (Signed)
   Covid-19 Vaccination Clinic  Name:  Ariana Juul    MRN: [id number] DOB: 15-Dec-1940  06/27/2019  Ms. Kopischke was observed post Covid-19 immunization for 15 minutes without incidence. She was provided with Vaccine Information Sheet and instruction to access the V-Safe system.   Ms. Donnelly was instructed to call 911 with any severe reactions post vaccine: Marland Kitchen Difficulty breathing  . Swelling of your face and throat  . A fast heartbeat  . A bad rash all over your body  . Dizziness and weakness    Immunizations Administered    Name Date Dose VIS Date Route   Pfizer COVID-19 Vaccine 06/27/2019  1:49 PM 0.3 mL 05/14/2019 Intramuscular   Manufacturer: [employer], [employer]   Lot: [id number]   NDC: [address]-7

## 2019-07-19 ENCOUNTER — Ambulatory Visit: Payer: Medicare Other | Attending: Internal Medicine

## 2019-07-19 DIAGNOSIS — Z23 Encounter for immunization: Secondary | ICD-10-CM | POA: Insufficient documentation

## 2019-07-19 NOTE — Progress Notes (Signed)
   Covid-19 Vaccination Clinic  Name:  Joaquina Nissen    MRN: [id number] DOB: June 22, 1940  07/19/2019  Ms. Deroy was observed post Covid-19 immunization for 15 minutes without incidence. She was provided with Vaccine Information Sheet and instruction to access the V-Safe system.   Ms. Langland was instructed to call 911 with any severe reactions post vaccine: Marland Kitchen Difficulty breathing  . Swelling of your face and throat  . A fast heartbeat  . A bad rash all over your body  . Dizziness and weakness    Immunizations Administered    Name Date Dose VIS Date Route   Pfizer COVID-19 Vaccine 07/19/2019  1:02 PM 0.3 mL 05/14/2019 Intramuscular   Manufacturer: [employer], [employer]   Lot: [id number]   NDC: [id number]

## 2019-10-12 ENCOUNTER — Other Ambulatory Visit (HOSPITAL_COMMUNITY): Payer: Self-pay | Admitting: Family Medicine

## 2019-10-12 DIAGNOSIS — I351 Nonrheumatic aortic (valve) insufficiency: Secondary | ICD-10-CM

## 2019-10-12 DIAGNOSIS — I517 Cardiomegaly: Secondary | ICD-10-CM

## 2019-10-28 ENCOUNTER — Ambulatory Visit (HOSPITAL_COMMUNITY): Payer: Medicare Other | Attending: Cardiology

## 2019-10-28 ENCOUNTER — Other Ambulatory Visit: Payer: Self-pay

## 2019-10-28 DIAGNOSIS — I517 Cardiomegaly: Secondary | ICD-10-CM | POA: Diagnosis not present

## 2019-10-28 DIAGNOSIS — I351 Nonrheumatic aortic (valve) insufficiency: Secondary | ICD-10-CM | POA: Diagnosis not present

## 2020-01-31 ENCOUNTER — Other Ambulatory Visit (HOSPITAL_COMMUNITY): Payer: Self-pay | Admitting: Family Medicine

## 2020-01-31 DIAGNOSIS — M79661 Pain in right lower leg: Secondary | ICD-10-CM

## 2020-02-01 ENCOUNTER — Ambulatory Visit (HOSPITAL_COMMUNITY): Payer: Medicare Other

## 2020-02-03 ENCOUNTER — Ambulatory Visit (HOSPITAL_COMMUNITY)
Admission: RE | Admit: 2020-02-03 | Discharge: 2020-02-03 | Disposition: A | Discharge status: Home / Self Care | Payer: Medicare Other | Source: Ambulatory Visit | Attending: Family Medicine | Admitting: Family Medicine

## 2020-02-03 ENCOUNTER — Other Ambulatory Visit: Payer: Self-pay

## 2020-02-03 DIAGNOSIS — M79661 Pain in right lower leg: Secondary | ICD-10-CM | POA: Diagnosis not present

## 2020-02-03 NOTE — Progress Notes (Signed)
Right lower extremity venous duplex completed. Refer to "CV Proc" under chart review to view preliminary results.  02/03/2020 3:51 PM Eula Fried., MHA, RVT, RDCS, RDMS

## 2020-02-17 ENCOUNTER — Ambulatory Visit: Payer: Medicare Other | Attending: Otolaryngology | Admitting: Physical Therapy

## 2020-02-17 ENCOUNTER — Other Ambulatory Visit: Payer: Self-pay

## 2020-02-17 DIAGNOSIS — R2681 Unsteadiness on feet: Secondary | ICD-10-CM | POA: Diagnosis present

## 2020-02-17 DIAGNOSIS — R42 Dizziness and giddiness: Secondary | ICD-10-CM | POA: Diagnosis present

## 2020-02-17 DIAGNOSIS — R2689 Other abnormalities of gait and mobility: Secondary | ICD-10-CM | POA: Insufficient documentation

## 2020-02-17 NOTE — Patient Instructions (Signed)
°  Gaze Stabilization: Tip Card  1.Target must remain in focus, not blurry, and appear stationary while head is in motion. 2.Perform exercises with small head movements (45 to either side of midline). 3.Increase speed of head motion so long as target is in focus. 4.If you wear eyeglasses, be sure you can see target through lens (therapist will give specific instructions for bifocal / progressive lenses). 5.These exercises may provoke dizziness or nausea. Work through these symptoms. If too dizzy, slow head movement slightly. Rest between each exercise. 6.Exercises demand concentration; avoid distractions. 7.For safety, perform standing exercises close to a counter, wall, corner, or next to someone.     Gaze Stabilization: Standing Feet Apart    Feet shoulder width apart, keeping eyes on target on wall _5-6___ feet away, tilt head down 15-30 and move head side to side for _60___ seconds. Repeat while moving head up and down for _60___ seconds. Do __3__ sessions per day.   Copyright  VHI. All rights reserved.

## 2020-02-18 ENCOUNTER — Encounter: Payer: Self-pay | Admitting: Physical Therapy

## 2020-02-18 NOTE — Therapy (Signed)
[facility] [address] [place], [place], [address] Phone: [phone]   Fax:  [phone]  Physical Therapy Evaluation  Patient Details  Name: Sherri Bishop MRN: [id number] Date of Birth: Sep 04, 1940 Referring Provider (PT): Dr. Newman Pies, MD   Encounter Date: 02/17/2020   PT End of Session - 02/18/20 1447    Visit Number 1    Number of Visits 7   eval + 6 visits   Date for PT Re-Evaluation 03/31/20    Authorization Type [employer] medicare    Authorization Time Period 02-17-20 - 04-18-20    PT Start Time 1615    PT Stop Time 1700    PT Time Calculation (min) 45 min    Activity Tolerance Patient tolerated treatment well    Behavior During Therapy [facility] for tasks assessed/performed           Past Medical History:  Diagnosis Date  . Coronary artery disease   . Hypertension   . Hypothyroidism   . Vertigo     Past Surgical History:  Procedure Laterality Date  . CHOLECYSTECTOMY      There were no vitals filed for this visit.    Subjective Assessment - 02/17/20 1623    Subjective Pt states she has had unsteadiness and imbalance for past 25 yrs but has gotten worse (more constant) within past 10 yrs and has become more constant within last couple of years; states it is frustrating and "a nuisance"; states she feels that if she moves quickly or moves eyes quickly there is a swirling - occurs more when she is up (not as intense when she is in seated position)    Pertinent History OA, h/o chronic dizziness    Patient Stated Goals improve balance and reduce dizziness    Currently in Pain? Yes    Pain Score 2     Pain Location Back    Pain Orientation Lower;Other (Comment)   on coccyx   Pain Descriptors / Indicators Aching;Dull;Discomfort    Pain Type Chronic pain    Pain Onset More than a month ago    Pain Frequency Constant    Aggravating Factors  walking    Pain Relieving Factors tylenol helps               [facility] PT Assessment - 02/18/20 0001      Assessment   Medical Diagnosis Vertigo     Referring Provider (PT) Dr. Newman Pies, MD    Onset Date/Surgical Date --   progressive worsening in past year    Prior Therapy pt states she PT for same problem in [place] many years ago - states it did not help      Precautions   Precautions Fall      Balance Screen   Has the patient fallen in the past 6 months No    Has the patient had a decrease in activity level because of a fear of falling?  No    Is the patient reluctant to leave their home because of a fear of falling?  No      Prior Function   Level of Independence Independent      Observation/Other Assessments   Focus on Therapeutic Outcomes (FOTO)  pt's FS primary measure 53/100 with risk adjusted 58/100;      Other Surveys  Dizziness Handicap Inventory [facility])    Dizziness Handicap Inventory (DHI)  46% (moderate handicap group)       Ambulation/Gait   Ambulation/Gait  Yes    Ambulation/Gait Assistance 6: Modified independent (Device/Increase time)    Ambulation Distance (Feet) 100 Feet    Assistive device None    Gait Pattern Within Functional Limits    Ambulation Surface Level;Indoor                  Vestibular Assessment - 02/18/20 0001      Vestibular Assessment   General Observation Pt is an 79 yr old lady with c/o chronic otologic issues, dizziness and imbalance:  pt reports she has noted being off balance for past several months; saw Dr. 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 and also bilateral eustachian tube dysfunction       Symptom Behavior   Subjective history of current problem pt states she has chronic sinus problems & h/o chronic ear aches; states she may have to have tubes put in her ears if problem persists     Type of Dizziness  Blurred vision;Imbalance;Unsteady with head/body turns;Lightheadedness;"Funny feeling in head"    Frequency  of Dizziness daily - is mostly constant in occurrence but varies in intensity    Duration of Dizziness constant - is "always there"    Symptom Nature Constant;Motion provoked    Aggravating Factors Comment;Activity in general   Darkness   Relieving Factors No known relieving factors    Progression of Symptoms Worse   more constant   History of similar episodes initially started about 25 yrs ago - has gotten worse in past few years       Oculomotor Exam   Oculomotor Alignment Normal    Spontaneous Absent    Head shaking Horizontal Absent    Head Shaking Vertical Absent    Smooth Pursuits Intact    Saccades Intact      Visual Acuity   Static line 8    Dynamic line 3   no c/o dizziness upon completion of test      Positional Sensitivities   Head Turning x 5 Mild dizziness    Head Nodding x 5 Mild dizziness              Objective measurements completed on examination: See above findings.               PT Education - 02/18/20 1445    Education Details x1 viewing in standing for HEP    Person(s) Educated Patient    Methods Explanation;Demonstration;Handout    Comprehension Verbalized understanding;Returned demonstration               PT Long Term Goals - 02/18/20 1459      PT LONG TERM GOAL #1   Title Pt will improve DVA to </= 3 line difference to demo improved gaze stabilization.    Baseline 5 line difference (line 8/3)    Time 6    Period Weeks    Status New    Target Date 03/31/20      PT LONG TERM GOAL #2   Title Improve DHI from 46% to </= 28% to demo improvement in vertigo and for improved quality of life.    Time 6    Period Weeks    Status New    Target Date 03/31/20      PT LONG TERM GOAL #3   Title Increase FGA score by at least 4 points for reduced fall risk and to demo improved balance with gait.  Time 6    Period Weeks    Status New    Target Date 03/31/20      PT LONG TERM GOAL #4   Title Perform SOT and establish goal as  appropriate.    Time 6    Period Weeks    Status New    Target Date 03/31/20      PT LONG TERM GOAL #5   Title Independent in HEP for balance and vestibular exercises.    Time 6    Period Weeks    Status New    Target Date 03/31/20                  Plan - 02/18/20 1449    Clinical Impression Statement Pt is an 79 yr old lady with long history of chronic otologic issues and dizziness, and more recently progression of imbalance within past several months.  Pt reports she has frequent ear ahces and pressure and reports "crickets sound" in her ears.  Pt was evaluated by Dr. Suszanne Conners in August 2021 and was told that her dizziness and balance disturbance was likely central in etiology.  Pt reports no spinning sensation with any movements.  Pt presents with abnormal VOR with DVA 5 line difference from SVA, however, pt reported no incr. dizziness upon completion of test.  Pt presents with decreased high level balance skills with inability to perform Sharpened Romberg (age-related balance deficits).  Pt will benefit from vestibular rehab to address balance and vestibular deficits.    Personal Factors and Comorbidities Comorbidity 2;Age;Past/Current Experience;Time since onset of injury/illness/exacerbation    Comorbidities osteoporosis, h/o chronic otologic issues, osteopenia, bil. eustachian tube dysfunction, chronic LBP    Examination-Activity Limitations Transfers;Locomotion Level;Bend;Stand;Stairs;Other;Caring for Others    Examination-Participation Restrictions Cleaning;Meal Prep;Community Activity;Interpersonal Relationship;Laundry;Shop    Stability/Clinical Decision Making Evolving/Moderate complexity    Rehab Potential Good    PT Frequency 1x / week    PT Duration 6 weeks    PT Treatment/Interventions ADLs/Self Care Home Management;Vestibular;Patient/family education;Gait training;Therapeutic activities;Stair training;Therapeutic exercise;Balance training;Neuromuscular re-education    PT  Next Visit Plan do SOT, check x1 viewing; do MSQ    PT Home Exercise Plan x1 viewing    Consulted and Agree with Plan of Care Patient           Patient will benefit from skilled therapeutic intervention in order to improve the following deficits and impairments:  Difficulty walking, Dizziness, Decreased balance  Visit Diagnosis: Dizziness and giddiness - Plan: PT plan of care cert/re-cert  Other abnormalities of gait and mobility - Plan: PT plan of care cert/re-cert  Unsteadiness on feet - Plan: PT plan of care cert/re-cert     Problem List Patient Active Problem List   Diagnosis Date Noted  . 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 [place], [place], [address] Phone: [phone]   Fax:  [phone]  Name: Sherri Bishop MRN: [id number] Date of Birth: January 15, 1941

## 2020-03-09 ENCOUNTER — Ambulatory Visit: Payer: Medicare Other | Admitting: Physical Therapy

## 2020-03-16 ENCOUNTER — Encounter: Payer: Medicare Other | Admitting: Physical Therapy

## 2020-03-23 ENCOUNTER — Encounter: Payer: Medicare Other | Admitting: Physical Therapy

## 2020-03-30 ENCOUNTER — Encounter: Payer: Medicare Other | Admitting: Physical Therapy

## 2020-09-19 IMAGING — NM NM BONE WHOLE BODY
2 series · 2 of 2 positions shown · non-contrast
Comparison: Lumbar MRI dated 02/26/2019

CLINICAL DATA: Multiple lumbar bone lesions noted on MRI of the
lumbar spine dated 02/26/2019.

EXAM:
NUCLEAR MEDICINE WHOLE BODY BONE SCAN
TECHNIQUE: Whole body anterior and posterior images were obtained approximately
3 hours after intravenous injection of radiopharmaceutical.
RADIOPHARMACEUTICALS:  21.0 mCi Eechnetium-88m MDP IV

[Series 1: whole body · 2.66mm/px · 1 of 1 slices shown (1 of 2)]
[im 1/1]
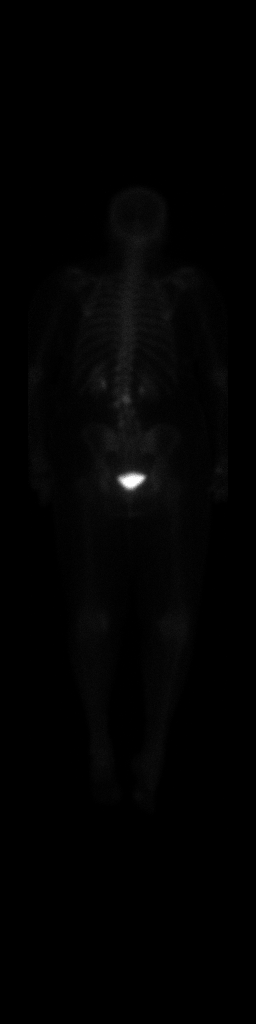

[Series 1: whole body · 2.66mm/px · 1 of 1 slices shown (2 of 2)]
[im 1/1]
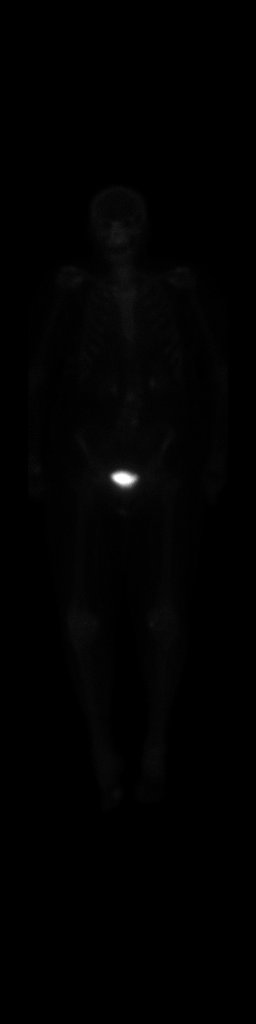

[2 of 2 positions shown; findings below may reference images not displayed]

FINDINGS: There is focal increased activity of the right side of the vertebral
endplates at L2-3 and of the left side of the vertebral endplates at
L4-5 which correlate with degenerative changes of the endplates seen
on the MRI dated 02/26/2019.

There is minimal increased activity at the medial aspect of the left
knee and at the first MTP joint of the right foot. No other
significant bone activity.

Specifically, the patient does not have any abnormal activity in the
left side of the L5 vertebral body or in the right side of the L2
vertebral body to correlate with the lesions noted on lumbar MRI.
IMPRESSION: No evidence of metastatic disease or other significant abnormality
on bone scan. Activity in the lumbar spine is consistent with the
degenerative changes of the vertebral endplates at L2-3 and L4-5 as
described. The lesions at L2 and L5 on the prior MRI demonstrate no
increased activity on bone scan and are felt to be benign.

## 2021-05-22 ENCOUNTER — Other Ambulatory Visit: Payer: Self-pay | Admitting: Rehabilitation

## 2021-05-22 DIAGNOSIS — M5116 Intervertebral disc disorders with radiculopathy, lumbar region: Secondary | ICD-10-CM

## 2021-05-22 DIAGNOSIS — M48062 Spinal stenosis, lumbar region with neurogenic claudication: Secondary | ICD-10-CM

## 2021-05-22 DIAGNOSIS — M419 Scoliosis, unspecified: Secondary | ICD-10-CM

## 2021-06-16 ENCOUNTER — Other Ambulatory Visit: Payer: Self-pay

## 2021-06-16 ENCOUNTER — Ambulatory Visit
Admission: RE | Admit: 2021-06-16 | Discharge: 2021-06-16 | Disposition: A | Discharge status: Home / Self Care | Payer: Medicare Other | Source: Ambulatory Visit | Attending: Rehabilitation | Admitting: Rehabilitation

## 2021-06-16 DIAGNOSIS — M419 Scoliosis, unspecified: Secondary | ICD-10-CM

## 2021-06-16 DIAGNOSIS — M5116 Intervertebral disc disorders with radiculopathy, lumbar region: Secondary | ICD-10-CM

## 2021-06-16 DIAGNOSIS — M48062 Spinal stenosis, lumbar region with neurogenic claudication: Secondary | ICD-10-CM

## 2021-07-03 ENCOUNTER — Other Ambulatory Visit: Payer: Self-pay | Admitting: Family Medicine

## 2021-07-03 DIAGNOSIS — E2839 Other primary ovarian failure: Secondary | ICD-10-CM

## 2021-12-11 ENCOUNTER — Ambulatory Visit
Admission: RE | Admit: 2021-12-11 | Discharge: 2021-12-11 | Disposition: A | Discharge status: Home / Self Care | Payer: Medicare Other | Source: Ambulatory Visit | Attending: Family Medicine | Admitting: Family Medicine

## 2021-12-11 DIAGNOSIS — E2839 Other primary ovarian failure: Secondary | ICD-10-CM

## 2022-09-03 ENCOUNTER — Ambulatory Visit (INDEPENDENT_AMBULATORY_CARE_PROVIDER_SITE_OTHER): Payer: Medicare Other | Admitting: Neurology

## 2022-09-03 ENCOUNTER — Telehealth: Payer: Self-pay | Admitting: Neurology

## 2022-09-03 ENCOUNTER — Encounter: Payer: Self-pay | Admitting: Neurology

## 2022-09-03 VITALS — BP 132/66 | HR 80 | Ht 62.0 in | Wt 157.2 lb

## 2022-09-03 DIAGNOSIS — G3184 Mild cognitive impairment, so stated: Secondary | ICD-10-CM | POA: Diagnosis not present

## 2022-09-03 DIAGNOSIS — R413 Other amnesia: Secondary | ICD-10-CM

## 2022-09-03 DIAGNOSIS — H8111 Benign paroxysmal vertigo, right ear: Secondary | ICD-10-CM

## 2022-09-03 NOTE — Telephone Encounter (Signed)
[employer] medicare Sherri KaufmannLethea Bishop exp. 09/03/22-10/02/22 sent to GI [id number]

## 2022-09-03 NOTE — Progress Notes (Signed)
[facility] [address] [place]. [place] [address] [phone]       OFFICE CONSULT NOTE  [name] Date of Birth:  1940-07-18 Medical Record Number:  [id number]   Referring MD:  Kara Pacer  Reason for Referral:  amnesia  HPI: [name] is a 82 year old pleasant Caucasian lady seen today for initial office consultation visit for memory loss and cognitive concerns.  History is obtained from husband and review of electronic medical records and I have personally reviewed pertinent available imaging films in PACS.  She has past medical history of hypertension, hyperlipidemia, coronary artery disease, hypothyroidism, anxiety and benign positional vertigo.  She states that she has been having some memory difficulties for more than a year now.  She states she is particularly concerned in the last 6 months and the present sister was only 6 months older than her has been diagnosed with Alzheimer's and her husband died of advanced dementia.  She states she often loses words in sentences and has trouble remembering them but may remember them later.  On occasion also forgets names of her friends with people come back later.  She lives alone.  She is independent in all activities of daily living.  Still driving.  Her daughter lives nearby helps her balancing her checkbook during the tax..  Patient denies any recent episodes of TIA or stroke.  She saw me in 2017 for transient dizzy sensations and had a negative neurovascular workup and was felt to have peripheral vestibular dysfunction.  States she continues to have some transient vertigo sensation moves her head suddenly or turns her head in a certain direction.. She has no history of significant head injury with loss of consciousness, seizures, strokes or other prior significant neurological illnesses. ROS:   14 system review of systems is positive for memory difficulties, difficulty with remembering names, losing track of  words, speech difficulty, vertigo, dizziness all other systems negative  PMH:  Past Medical History:  Diagnosis Date   Anxiety    Coronary artery disease    Hyperlipidemia    Hypertension    Hypothyroidism    Vertigo     Social History:  Social History   Socioeconomic History   Marital status: Married    Spouse name: Not on file   Number of children: Not on file   Years of education: Not on file   Highest education level: Not on file  Occupational History   Not on file  Tobacco Use   Smoking status: Every Day    Packs/day: 1.00    Years: 51.00    Additional pack years: 0.00    Total pack years: 51.00    Types: Cigarettes   Smokeless tobacco: Not on file  Substance and Sexual Activity   Alcohol use: Not Currently    Comment: occasionally   Drug use: No   Sexual activity: Not on file  Other Topics Concern   Not on file  Social History Narrative   Not on file   Social Determinants of Health   Financial Resource Strain: Not on file  Food Insecurity: Not on file  Transportation Needs: Not on file  Physical Activity: Not on file  Stress: Not on file  Social Connections: Not on file  Intimate Partner Violence: Not on file    Medications:   Current Outpatient Medications on File Prior to Visit  Medication Sig Dispense Refill   acetaminophen (TYLENOL) 500 MG tablet Take 500 mg by mouth every 6 (six) hours  as needed for mild pain.     aspirin 81 MG chewable tablet Chew 81 mg by mouth daily.     atorvastatin (LIPITOR) 10 MG tablet Take 10 mg by mouth daily.     busPIRone (BUSPAR) 10 MG tablet Take 10 mg by mouth daily.     cholecalciferol (VITAMIN D) 400 UNITS TABS tablet Take 400 Units by mouth daily.     Cranberry 400 MG TABS Take 1 tablet by mouth daily.     diltiazem (CARDIZEM) 120 MG tablet Take 120 mg by mouth daily.     diphenhydrAMINE (SOMINEX) 25 MG tablet Take 25 mg by mouth at bedtime.     folic acid (FOLVITE) 1 MG tablet Take 1 mg by mouth daily.      levothyroxine (SYNTHROID) 125 MCG tablet      Melatonin 10 MG TABS Take 10 mg by mouth at bedtime.     Multiple Vitamins-Minerals (CENTRUM SILVER ULTRA WOMENS PO) Take 1 tablet by mouth daily.     omeprazole (PRILOSEC) 20 MG capsule Take 20 mg by mouth daily.     pseudoephedrine (SUDAFED) 30 MG tablet Take 30 mg by mouth 3 (three) times daily as needed for congestion.     vitamin B-12 (CYANOCOBALAMIN) 100 MCG tablet Take 100 mcg by mouth daily.     vitamin E 400 UNIT capsule Take 400 Units by mouth daily.     No current facility-administered medications on file prior to visit.    Allergies:  No Known Allergies  Physical Exam General: well developed, well nourished, seated, in no evident distress Head: head normocephalic and atraumatic.   Neck: supple with no carotid or supraclavicular bruits Cardiovascular: regular rate and rhythm, no murmurs Musculoskeletal: no deformity Skin:  no rash/petichiae Vascular:  Normal pulses all extremities  Neurologic Exam Mental Status: Awake and fully alert. Oriented to place and time. Recent and remote memory intact. Attention span, concentration and fund of knowledge appropriate. Mood and affect appropriate.  Diminished recall 1/3.  Able to name 16 animals which can walk on 4 legs.  Clock drawing 4/4.  On Mini-Mental status exam she scored 30/30. Cranial Nerves: Fundoscopic exam reveals sharp disc margins. Pupils equal, briskly reactive to light. Extraocular movements full without nystagmus. Visual fields full to confrontation. Hearing intact. Facial sensation intact. Face, tongue, palate moves normally and symmetrically.  Motor: Normal bulk and tone. Normal strength in all tested extremity muscles. Sensory.: intact to touch , pinprick , position and vibratory sensation.  Coordination: Rapid alternating movements normal in all extremities. Finger-to-nose and heel-to-shin performed accurately bilaterally.  Fukuda stepping test positive with patient moving  all place and rotating to the right. Gait and Station: Arises from chair without difficulty. Stance is normal. Gait demonstrates normal stride length and balance . Able to heel, toe and tandem walk without difficulty.  Reflexes: 1+ and symmetric. Toes downgoing.   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  She also has strong family history of Alzheimer's and is worried about getting it in the future.  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  She however has a strong family history and worried about developing Alzheimer's and recommend checking memory panel labs, apoE for Alzheimer's risk, MRI scan and EEG.  I recommend she increase participation in cognitively challenging activities like solving crossword puzzles, playing bridge, word searches and sudoku.  She can start using Prevagen 10 mg daily.  If she wants..  We also discussed memory compensation strategies.  I advised her to get up slowly and avoid sudden movements of her head and neck particularly to the right due to her history of paroxysmal positional vertigo.  Greater than 50% time during this 45-minute consultation visit was spent on counseling and coordination of care about her memory loss and mild cognitive impairment as well  as positional vertigo and answering questions. Antony Contras, MD  Note: This document was prepared with digital dictation and possible smart phrase technology. Any transcriptional errors that result from this process are unintentional.

## 2022-09-03 NOTE — Patient Instructions (Signed)
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  She however has a strong family history and worried about developing Alzheimer's and recommend checking memory panel labs, apoE for Alzheimer's risk, MRI scan and EEG.  I recommend she increase participation in cognitively challenging activities like solving crossword puzzles, playing bridge, word searches and sudoku.  She can start using Prevagen 10 mg daily.  If she wants..  We also discussed memory compensation strategies.  I advised her to get up slowly and avoid sudden movements of her head and neck particularly to the right due to her history of paroxysmal positional vertigo.  Memory Compensation Strategies  Use "WARM" strategy.  W= write it down  A= associate it  R= repeat it  M= make a mental note  2.   You can keep a Social worker.  Use a 3-ring notebook with sections for the following: calendar, important names and phone numbers,  medications, doctors' names/phone numbers, lists/reminders, and a section to journal what you did  each day.   3.    Use a calendar to write appointments down.  4.    Write yourself a schedule for the day.  This can be placed on the calendar or in a separate section of the Memory Notebook.  Keeping a  regular schedule can help memory.  5.    Use medication organizer with sections for each day or morning/evening pills.  You may need help loading it  6.    Keep a basket, or pegboard by the door.  Place items that you need to take out with you in the basket or on the pegboard.  You may also want to  include a message board for reminders.  7.    Use sticky notes.  Place sticky notes with reminders in a place where the task is performed.  For example: " turn off the  stove" placed by the stove, "lock the door" placed on the door at eye level, " take your medications" on  the  bathroom mirror or by the place where you normally take your medications.  8.    Use alarms/timers.  Use while cooking to remind yourself to check on food or as a reminder to take your medicine, or as a  reminder to make a call, or as a reminder to perform another task, etc.

## 2022-09-15 NOTE — Progress Notes (Signed)
Kindly inform the patient that lab work for reversible causes of memory loss was all satisfactory.  The screening blood test for Alzheimer's risk is not back yet.

## 2022-09-25 ENCOUNTER — Ambulatory Visit
Admission: RE | Admit: 2022-09-25 | Discharge: 2022-09-25 | Disposition: A | Discharge status: Home / Self Care | Payer: Medicare Other | Source: Ambulatory Visit | Attending: Neurology | Admitting: Neurology

## 2022-09-25 DIAGNOSIS — R413 Other amnesia: Secondary | ICD-10-CM

## 2022-09-25 MED ORDER — GADOPICLENOL 0.5 MMOL/ML IV SOLN
7.5000 mL | Freq: Once | INTRAVENOUS | Status: AC | PRN
  Administered 2022-09-25: 7.5 mL via INTRAVENOUS

## 2022-09-28 LAB — DEMENTIA PANEL
Homocysteine: 12.4 umol/L (ref 0.0–21.3)
RPR Ser Ql: NONREACTIVE
TSH: 1.85 u[IU]/mL (ref 0.450–4.500)
Vitamin B-12: 1254 pg/mL — ABNORMAL HIGH (ref 232–1245)

## 2022-09-28 LAB — APOE ALZHEIMER'S RISK

## 2022-10-03 ENCOUNTER — Ambulatory Visit (INDEPENDENT_AMBULATORY_CARE_PROVIDER_SITE_OTHER): Payer: Medicare Other | Admitting: Neurology

## 2022-10-03 DIAGNOSIS — R4182 Altered mental status, unspecified: Secondary | ICD-10-CM | POA: Diagnosis not present

## 2022-10-03 DIAGNOSIS — R413 Other amnesia: Secondary | ICD-10-CM

## 2022-10-04 NOTE — Progress Notes (Signed)
Kindly inform the patient that MRI scan of the brain shows mild age appropriate changes of hardening of the arteries.  This appears expectedly progressed compared to previous MRI from 2017.  No new or surprising findings.  Nothing to worry about

## 2022-10-04 NOTE — Progress Notes (Signed)
Kindly inform the patient that EEG or brainwave study was normal.  No seizure activity noted.

## 2022-10-07 ENCOUNTER — Telehealth: Payer: Self-pay

## 2022-10-07 NOTE — Telephone Encounter (Signed)
Called patient LVM #1  "IF patient calls back please advise the patient that per Dr. Pearlean Brownie "the patient that MRI scan of the brain shows mild age appropriate changes of hardening of the arteries.  This appears expectedly progressed compared to previous MRI from 2017.  No new or surprising findings.  Nothing to worry about".

## 2022-10-07 NOTE — Telephone Encounter (Signed)
I left a voicemail providing normal results.

## 2022-10-07 NOTE — Telephone Encounter (Signed)
-----   Message from Deatra James, RN sent at 10/07/2022  9:36 AM EDT -----  ----- Message ----- From: Micki Riley, MD Sent: 10/04/2022   8:33 AM EDT To: Gna-Pod 2 Results  Kindly inform the patient that EEG or brainwave study was normal.  No seizure activity noted.

## 2022-10-07 NOTE — Telephone Encounter (Signed)
-----   Message from Deatra James, RN sent at 10/07/2022  9:33 AM EDT -----  ----- Message ----- From: Micki Riley, MD Sent: 10/04/2022   8:09 AM EDT To: Gna-Pod 2 Results  Kindly inform the patient that MRI scan of the brain shows mild age appropriate changes of hardening of the arteries.  This appears expectedly progressed compared to previous MRI from 2017.  No new or surprising findings.  Nothing to worry about

## 2022-10-07 NOTE — Telephone Encounter (Signed)
Message relayed to pt

## 2023-04-02 ENCOUNTER — Ambulatory Visit: Payer: Medicare Other | Admitting: Neurology

## 2023-07-07 ENCOUNTER — Other Ambulatory Visit: Payer: Self-pay | Admitting: Family Medicine

## 2023-07-07 DIAGNOSIS — E2839 Other primary ovarian failure: Secondary | ICD-10-CM

## 2024-01-30 ENCOUNTER — Other Ambulatory Visit: Payer: Self-pay | Admitting: Family Medicine

## 2024-01-30 DIAGNOSIS — R634 Abnormal weight loss: Secondary | ICD-10-CM

## 2024-02-06 ENCOUNTER — Ambulatory Visit
Admission: RE | Admit: 2024-02-06 | Discharge: 2024-02-06 | Disposition: A | Discharge status: Home / Self Care | Source: Ambulatory Visit | Attending: Family Medicine | Admitting: Family Medicine

## 2024-02-06 DIAGNOSIS — R634 Abnormal weight loss: Secondary | ICD-10-CM

## 2024-02-06 MED ORDER — IOPAMIDOL (ISOVUE-300) INJECTION 61%
80.0000 mL | Freq: Once | INTRAVENOUS | Status: AC | PRN
  Administered 2024-02-06: 80 mL via INTRAVENOUS

## 2024-02-26 ENCOUNTER — Other Ambulatory Visit: Payer: Medicare Other

## 2024-03-16 ENCOUNTER — Ambulatory Visit (HOSPITAL_BASED_OUTPATIENT_CLINIC_OR_DEPARTMENT_OTHER)
Admission: RE | Admit: 2024-03-16 | Discharge: 2024-03-16 | Disposition: A | Discharge status: Home / Self Care | Source: Ambulatory Visit | Attending: Family Medicine | Admitting: Family Medicine

## 2024-03-16 DIAGNOSIS — E2839 Other primary ovarian failure: Secondary | ICD-10-CM | POA: Insufficient documentation

## 2024-04-20 ENCOUNTER — Ambulatory Visit

## 2024-04-20 VITALS — BP 139/78 | HR 77 | Temp 97.7°F | Ht 62.0 in | Wt 158.8 lb

## 2024-04-20 DIAGNOSIS — K529 Noninfective gastroenteritis and colitis, unspecified: Secondary | ICD-10-CM | POA: Diagnosis not present

## 2024-04-20 DIAGNOSIS — R61 Generalized hyperhidrosis: Secondary | ICD-10-CM | POA: Diagnosis not present

## 2024-04-20 DIAGNOSIS — Z720 Tobacco use: Secondary | ICD-10-CM

## 2024-04-20 DIAGNOSIS — R918 Other nonspecific abnormal finding of lung field: Secondary | ICD-10-CM | POA: Diagnosis not present

## 2024-04-20 DIAGNOSIS — F17210 Nicotine dependence, cigarettes, uncomplicated: Secondary | ICD-10-CM

## 2024-04-20 NOTE — Progress Notes (Signed)
 New Patient Pulmonology Office Visit   Subjective:  Patient ID: Sherri Bishop, female    DOB: [birth year]  MRN: [id number]  Referred by: Chrystal Lamarr RAMAN, *  CC:  Chief Complaint  Patient presents with   Lung Mass    Pulmonary Nodules. Pt denies SOB and wheezing but states she does have an occasional dry cough.     HPI Sherri Bishop is a 83 y.o. female who is referred to this clinic for lung nodules  Pt is a chronic smoker who quit smoking earlier this year She denies much dyspnea, she doesn't exert herself due to back pain She underwent CT chest 02/2024 for night sweats. It revealed multiple small pulmonary nodules- largest on left measuring 6 mm. 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  Current Outpatient Medications:    acetaminophen (TYLENOL) 500 MG tablet, Take 500 mg by mouth every 6 (six) hours as needed for mild pain., Disp: , Rfl:    aspirin 81 MG chewable tablet, Chew 81 mg by mouth daily., Disp: , Rfl:    cholecalciferol (VITAMIN D) 400 UNITS TABS tablet, Take 400 Units by mouth daily., Disp: , Rfl:    Cranberry 400 MG TABS, Take 1 tablet by mouth daily., Disp: , Rfl:    diltiazem (CARDIZEM) 120 MG tablet, Take 120 mg by mouth daily., Disp: , Rfl:    folic acid (FOLVITE) 1 MG tablet, Take 1 mg by mouth daily., Disp: , Rfl:    levothyroxine (SYNTHROID) 125 MCG tablet, , Disp: , Rfl:    Melatonin 10 MG TABS, Take 10 mg by mouth at bedtime., Disp: , Rfl:    Multiple Vitamins-Minerals (CENTRUM SILVER ULTRA WOMENS PO), Take 1 tablet by mouth daily., Disp: , Rfl:    omeprazole (PRILOSEC) 20 MG  capsule, Take 20 mg by mouth daily., Disp: , Rfl:    pseudoephedrine (SUDAFED) 30 MG tablet, Take 30 mg by mouth 3 (three) times daily as needed for congestion., Disp: , Rfl:    vitamin B-12 (CYANOCOBALAMIN) 100 MCG tablet, Take 100 mcg by mouth daily., Disp: , Rfl:    vitamin E 400 UNIT capsule, Take 400 Units by mouth daily., Disp: , Rfl:    atorvastatin (LIPITOR) 10 MG tablet, Take 10 mg by mouth daily. (Patient not taking: Reported on 04/20/2024), Disp: , Rfl:    busPIRone (BUSPAR) 10 MG tablet, Take 10 mg by mouth daily. (Patient not taking: Reported on 04/20/2024), Disp: , Rfl:    diphenhydrAMINE (SOMINEX) 25 MG tablet, Take 25 mg by mouth at bedtime. (Patient not taking: Reported on 04/20/2024), Disp: , Rfl:  Past Medical History:  Diagnosis Date   Anxiety    Coronary artery disease    Hyperlipidemia    Hypertension    Hypothyroidism    Vertigo    Past Surgical History:  Procedure Laterality Date   CHOLECYSTECTOMY     Family History  Problem Relation Age of Onset   Stroke Brother    Social History   Socioeconomic History   Marital status: Married    Spouse name: Not on file   Number of children:  Not on file   Years of education: Not on file   Highest education level: Not on file  Occupational History   Not on file  Tobacco Use   Smoking status: Former    Current packs/day: 1.00    Average packs/day: 1 pack/day for 51.0 years (51.0 ttl pk-yrs)    Types: Cigarettes   Smokeless tobacco: Not on file   Tobacco comments:    Smoked about 30 years, 1ppd. Quit April 2025  Substance and Sexual Activity   Alcohol use: Not Currently    Comment: occasionally   Drug use: No   Sexual activity: Not on file  Other Topics Concern   Not on file  Social History Narrative   Not on file   Social Drivers of Health   Financial Resource Strain: Patient Unable To Answer (03/09/2024)   Received from [facility]   Overall Financial Resource Strain (CARDIA)    How hard is it for  you to pay for the very basics like food, housing, medical care, and heating?: Patient unable to answer  Food Insecurity: No Food Insecurity (03/09/2024)   Received from [facility]   Hunger Vital Sign    Within the past 12 months, you worried that your food would run out before you got the money to buy more.: Never true    Within the past 12 months, the food you bought just didn't last and you didn't have money to get more.: Never true  Transportation Needs: No Transportation Needs (03/09/2024)   Received from [facility] - Transportation    In the past 12 months, has lack of transportation kept you from medical appointments or from getting medications?: No    In the past 12 months, has lack of transportation kept you from meetings, work, or from getting things needed for daily living?: No  Physical Activity: Not on file  Stress: Not on file  Social Connections: Not on file  Intimate Partner Violence: Not on file         Objective:  BP 139/78 Comment: rechecked due to elevated BP at initial reading  Pulse 77   Temp 97.7 F (36.5 C)   Ht 5' 2 (1.575 m) Comment: Per pt  Wt 158 lb 12.8 oz (72 kg)   SpO2 95% Comment: RA  BMI 29.04 kg/m    Physical Exam Constitutional:      General: She is not in acute distress.    Appearance: Normal appearance.  HENT:     Mouth/Throat:     Mouth: Mucous membranes are moist.  Cardiovascular:     Rate and Rhythm: Normal rate.  Pulmonary:     Effort: No respiratory distress.     Breath sounds: No wheezing or rales.  Musculoskeletal:     Right lower leg: No edema.     Left lower leg: No edema.  Skin:    General: Skin is warm.  Neurological:     Mental Status: She is alert and oriented to person, place, and time.  Psychiatric:        Mood and Affect: Mood normal.     Diagnostic Review:    Pft     No data to display               Results CT chest 02/2023 The lungs are well aerated bilaterally. A 4 mm  nodule is noted in the left lower lobe. Additionally smaller scattered nodules are noted. 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 Focal 6  mm nodule is noted in the left lower lobe on image number 90 of series 7. No focal infiltrate or effusion is seen.      Assessment & Plan:   Assessment & Plan Lung nodules Chronic smoker Incidental lung nodules during work up of night sweats No evidence of malignancy on ct abd pelvis otherwise Will set up a short interval f/up ct chest 05/2024 Discussed potential differentials Orders:   CT SUPER D CHEST WO CONTRAST; Future  Tobacco abuse Congratulated on quitting smoking     Chronic night sweats Unknown cause Reports ongoing for several months Reports weight loss due to diarrhoea If lung lesions persist or worsen, may need to explore atypical infections in lungs- pt is not excited for  aggressive work up including biopsies but is open to discussion if deemed necessary based on repeat scans    Chronic diarrhea Reports weight loss due to diarrhoea Pt follows with GI Advised to consider colon malignancy work up as suggested by GI      Thank you for the opportunity to take part in the care of Sherri Bishop   Return in about 5 weeks (around 05/25/2024).   Sherri Schaafsma Pleas, Sherri Bishop [facility] Office: [phone]

## 2024-04-20 NOTE — Patient Instructions (Signed)
 It was a pleasure to see you today. Your will receive a call for your CT chest scheduling.  We will review your CT chest with you in next visit

## 2024-05-21 ENCOUNTER — Inpatient Hospital Stay: Admission: RE | Admit: 2024-05-21

## 2024-05-21 DIAGNOSIS — R918 Other nonspecific abnormal finding of lung field: Secondary | ICD-10-CM

## 2024-05-24 ENCOUNTER — Telehealth: Payer: Self-pay

## 2024-05-24 ENCOUNTER — Ambulatory Visit

## 2024-05-24 DIAGNOSIS — R918 Other nonspecific abnormal finding of lung field: Secondary | ICD-10-CM

## 2024-05-24 NOTE — Telephone Encounter (Addendum)
 Reviewed CT chest from December 19/2025 which shows that the smaller peripheral nodules are increasing in size compared to September also the right lower lobe medial nodule has increased and now measures 1.4 x 1.7 cm in size.  She also has some mediastinal lymphadenopathy. I discussed the above findings with the patient and offered her to proceed with bronchoscopic biopsy now.  I explained the procedure, risks and benefit. I made her aware that these look too concerning given growth over 8 weeks She reported she feels fine right now and She is unsure if she want to proceed with biopsy and subsequent treatment if this is cancer given her age.  I made her aware that pts may be asymptomatic with lung malignancy until its widely metastatic or causes obstruction in airways/lung collapse or effusions She would like to think through it and discuss with her family first. Patient will call us  with her decision.  In the meantime, we will schedule a PET/CT, which patient is agreeable for.

## 2024-05-25 ENCOUNTER — Other Ambulatory Visit

## 2024-06-07 ENCOUNTER — Encounter (HOSPITAL_COMMUNITY)
Admission: RE | Admit: 2024-06-07 | Discharge: 2024-06-07 | Disposition: A | Discharge status: Home / Self Care | Source: Ambulatory Visit

## 2024-06-07 DIAGNOSIS — R918 Other nonspecific abnormal finding of lung field: Secondary | ICD-10-CM | POA: Diagnosis present

## 2024-06-07 LAB — GLUCOSE, CAPILLARY: Glucose-Capillary: 120 mg/dL — ABNORMAL HIGH (ref 70–99)

## 2024-06-07 MED ORDER — FLUDEOXYGLUCOSE F - 18 (FDG) INJECTION
7.6000 | Freq: Once | INTRAVENOUS | Status: AC
  Administered 2024-06-07: 7.69 via INTRAVENOUS

## 2024-06-14 ENCOUNTER — Telehealth: Payer: Self-pay

## 2024-06-14 NOTE — Telephone Encounter (Signed)
 PET/CT again looks concerning Previously called for abnormal ct chest results Will need to discuss bx Please check is pt is willing to come in this week Thursday 15th Jan to review images and discuss next steps.

## 2024-06-15 NOTE — Telephone Encounter (Signed)
 ATC x1.  Left detailed message on VM per DPR.  I held the 1/15 slot at 3:45 pm if she can come in, just schedule her in that slot.  Thank you.

## 2024-06-15 NOTE — Telephone Encounter (Signed)
 I called and spoke with patient and verified she will come in on Thursday 06/17/2024 at 3:45 pm, to arrive by 3:30 pm  Nothing further needed.

## 2024-06-17 ENCOUNTER — Ambulatory Visit

## 2024-06-30 ENCOUNTER — Ambulatory Visit

## 2024-07-01 NOTE — Telephone Encounter (Signed)
 Pt has concerning PET/CT and we wanted to discuss the results and possible options including biopsy. Would advise to come in sooner within next few weeks- when she feels comfortable weather wise- okay to use blocked slots.

## 2024-07-28 ENCOUNTER — Ambulatory Visit

## 2024-09-07 ENCOUNTER — Ambulatory Visit
